# Patient Record
Sex: Male | Born: 1960 | Race: White | Hispanic: No | Smoking: Never smoker
Health system: Southern US, Community
[De-identification: ages and names within clinical notes are randomized; demographics above are authoritative.]

## PROBLEM LIST (undated history)

## (undated) DIAGNOSIS — F329 Major depressive disorder, single episode, unspecified: Secondary | ICD-10-CM

## (undated) DIAGNOSIS — C801 Malignant (primary) neoplasm, unspecified: Secondary | ICD-10-CM

## (undated) DIAGNOSIS — F32A Depression, unspecified: Secondary | ICD-10-CM

## (undated) DIAGNOSIS — I10 Essential (primary) hypertension: Secondary | ICD-10-CM

## (undated) DIAGNOSIS — I7300 Raynaud's syndrome without gangrene: Secondary | ICD-10-CM

## (undated) DIAGNOSIS — K219 Gastro-esophageal reflux disease without esophagitis: Secondary | ICD-10-CM

## (undated) HISTORY — PX: MELANOMA EXCISION: SHX5266

## (undated) HISTORY — PX: BACK SURGERY: SHX140

## (undated) HISTORY — PX: KNEE SURGERY: SHX244

---

## 2003-12-22 ENCOUNTER — Other Ambulatory Visit: Admission: RE | Admit: 2003-12-22 | Discharge: 2003-12-22 | Payer: Self-pay | Admitting: Dermatology

## 2004-02-24 ENCOUNTER — Encounter (HOSPITAL_COMMUNITY): Admission: RE | Admit: 2004-02-24 | Discharge: 2004-02-24 | Payer: Self-pay | Admitting: Orthopedic Surgery

## 2005-07-17 ENCOUNTER — Emergency Department (HOSPITAL_COMMUNITY): Admission: EM | Admit: 2005-07-17 | Discharge: 2005-07-17 | Payer: Self-pay | Admitting: Emergency Medicine

## 2005-10-11 ENCOUNTER — Ambulatory Visit: Payer: Self-pay | Admitting: *Deleted

## 2005-10-17 ENCOUNTER — Encounter (HOSPITAL_COMMUNITY): Admission: RE | Admit: 2005-10-17 | Discharge: 2005-11-16 | Payer: Self-pay | Admitting: *Deleted

## 2005-10-17 ENCOUNTER — Ambulatory Visit: Payer: Self-pay | Admitting: *Deleted

## 2005-10-23 ENCOUNTER — Ambulatory Visit: Payer: Self-pay | Admitting: *Deleted

## 2006-03-18 ENCOUNTER — Ambulatory Visit (HOSPITAL_COMMUNITY): Admission: RE | Admit: 2006-03-18 | Discharge: 2006-03-18 | Payer: Self-pay | Admitting: Neurology

## 2006-05-07 ENCOUNTER — Emergency Department (HOSPITAL_COMMUNITY): Admission: EM | Admit: 2006-05-07 | Discharge: 2006-05-08 | Payer: Self-pay | Admitting: Emergency Medicine

## 2006-09-04 ENCOUNTER — Emergency Department (HOSPITAL_COMMUNITY): Admission: EM | Admit: 2006-09-04 | Discharge: 2006-09-04 | Payer: Self-pay | Admitting: Emergency Medicine

## 2006-10-17 ENCOUNTER — Emergency Department (HOSPITAL_COMMUNITY): Admission: EM | Admit: 2006-10-17 | Discharge: 2006-10-17 | Payer: Self-pay | Admitting: Emergency Medicine

## 2007-02-04 ENCOUNTER — Emergency Department (HOSPITAL_COMMUNITY): Admission: EM | Admit: 2007-02-04 | Discharge: 2007-02-04 | Payer: Self-pay | Admitting: Emergency Medicine

## 2007-02-28 ENCOUNTER — Inpatient Hospital Stay (HOSPITAL_COMMUNITY): Admission: EM | Admit: 2007-02-28 | Discharge: 2007-03-01 | Payer: Self-pay | Admitting: Emergency Medicine

## 2009-02-21 IMAGING — CR DG KNEE COMPLETE 4+V*R*
4 series · 4 of 4 positions shown · non-contrast
Comparison: None.

CLINICAL DATA: Fall, pain.
 RIGHT SHOULDER ? 3 VIEW:

[view not recorded (1 of 4)]
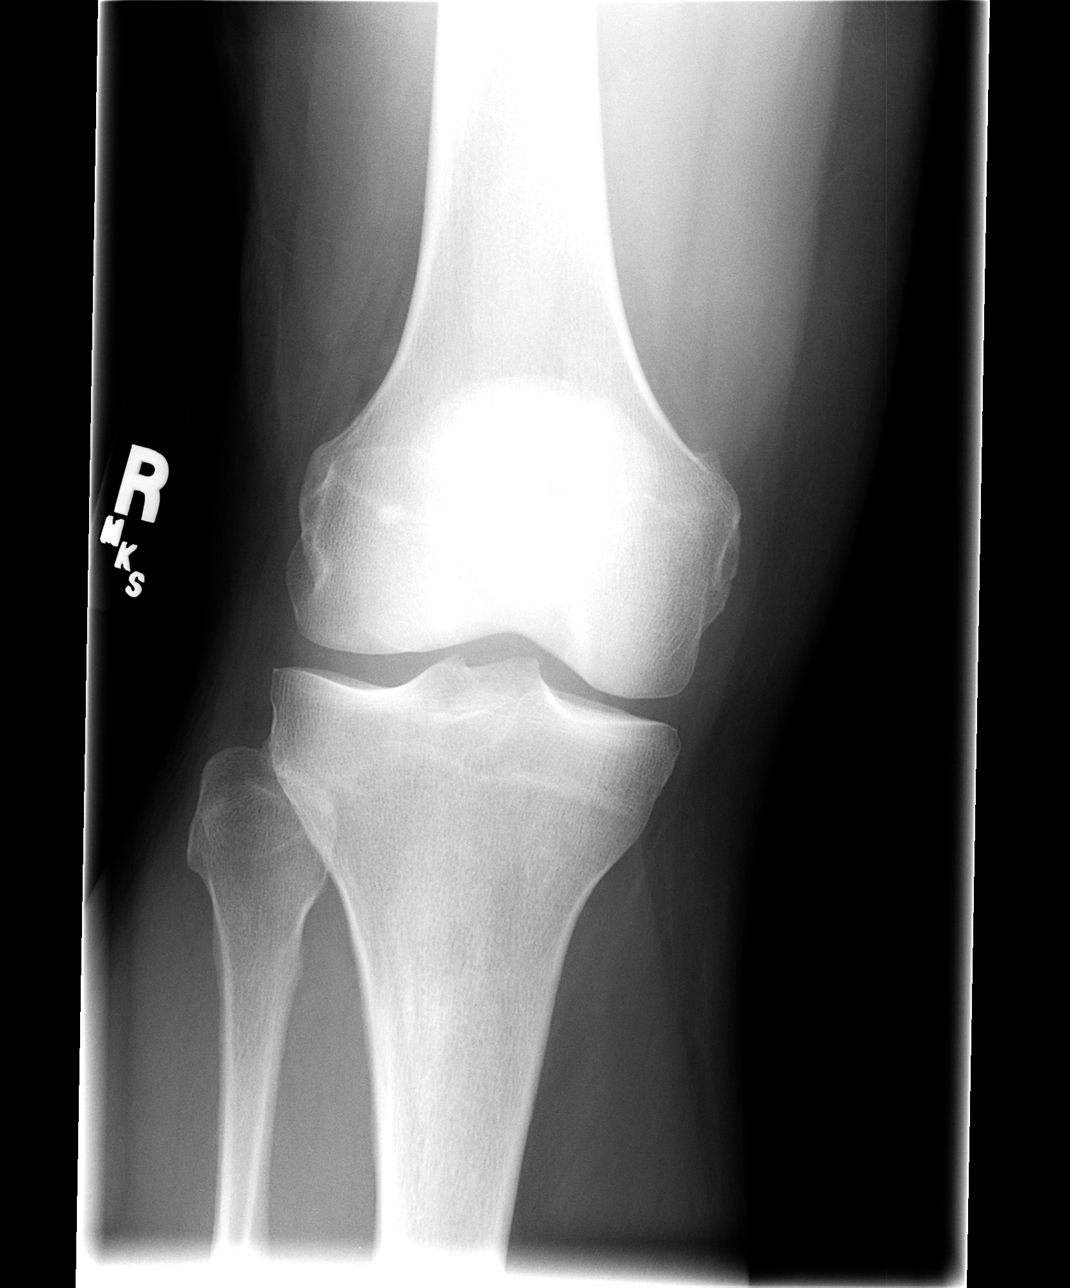

[view not recorded (2 of 4)]
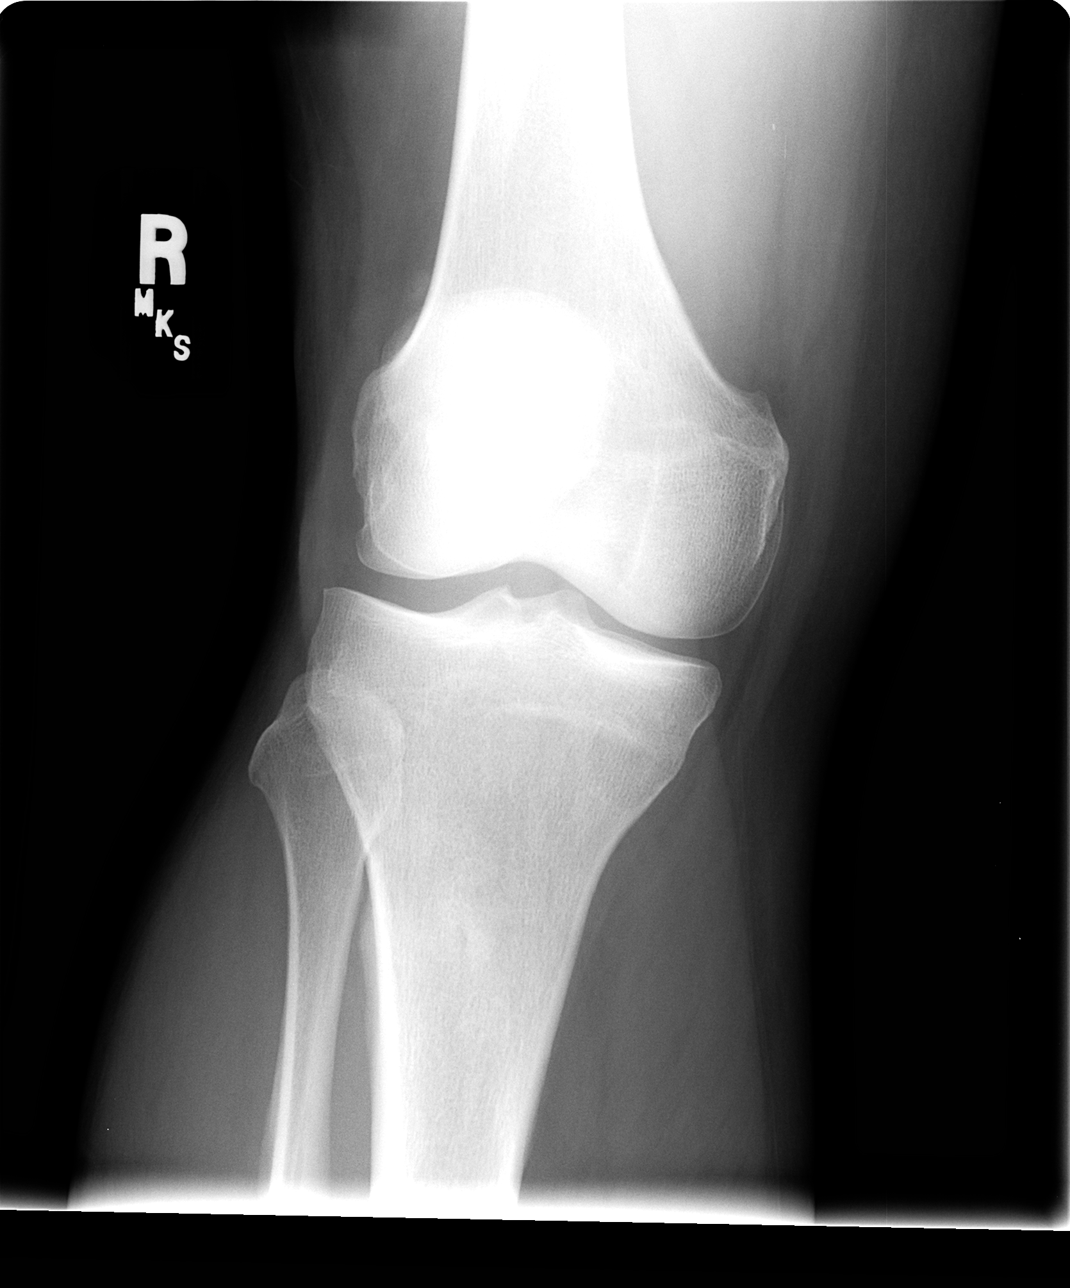

[view not recorded (3 of 4)]
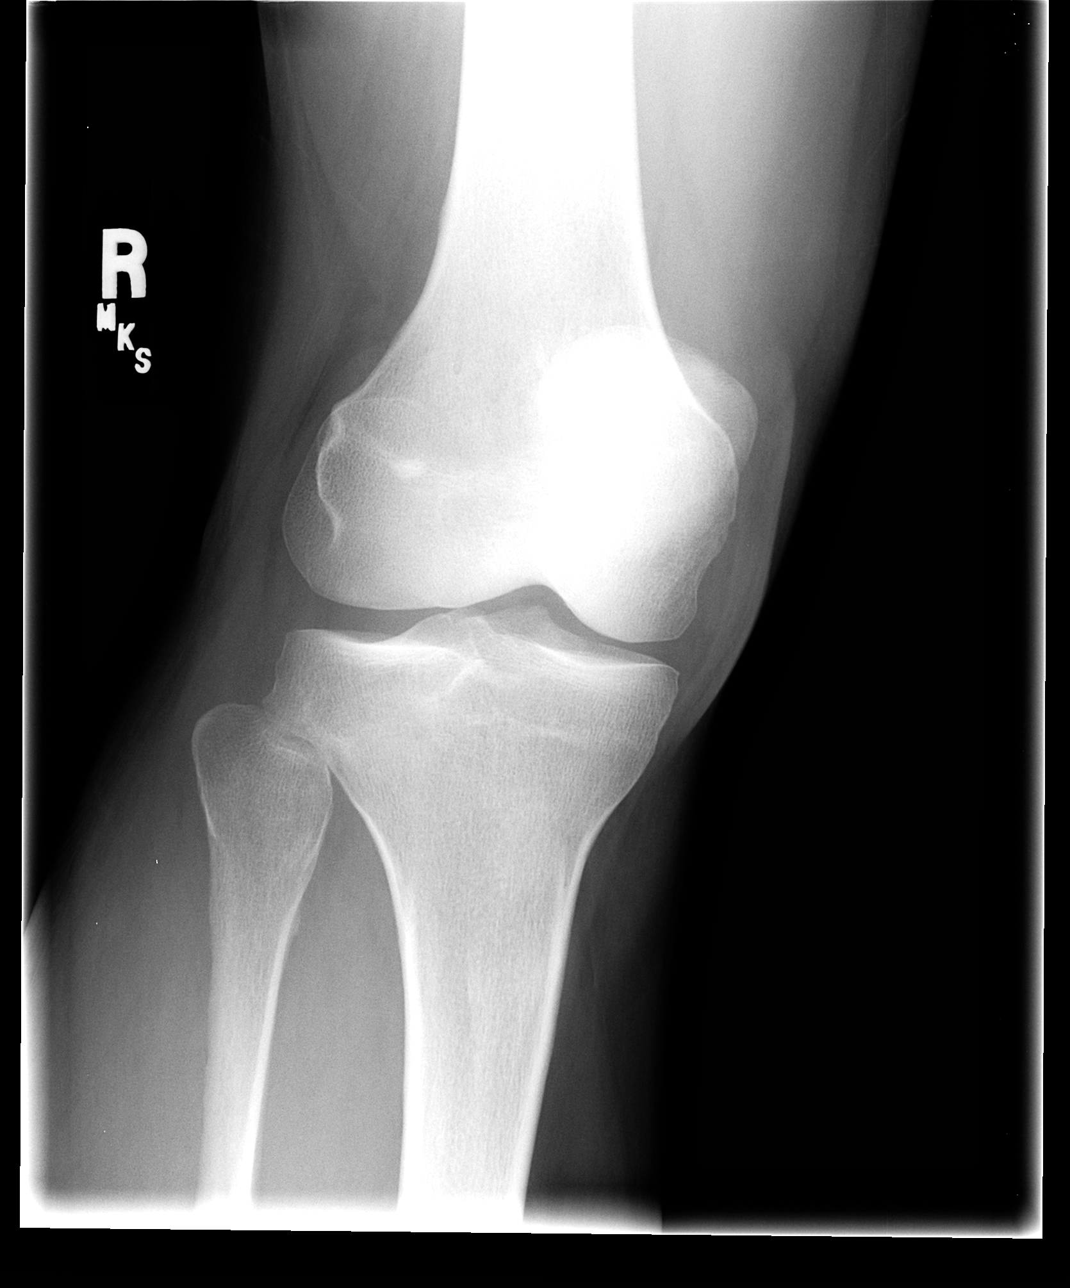

[view not recorded (4 of 4)]
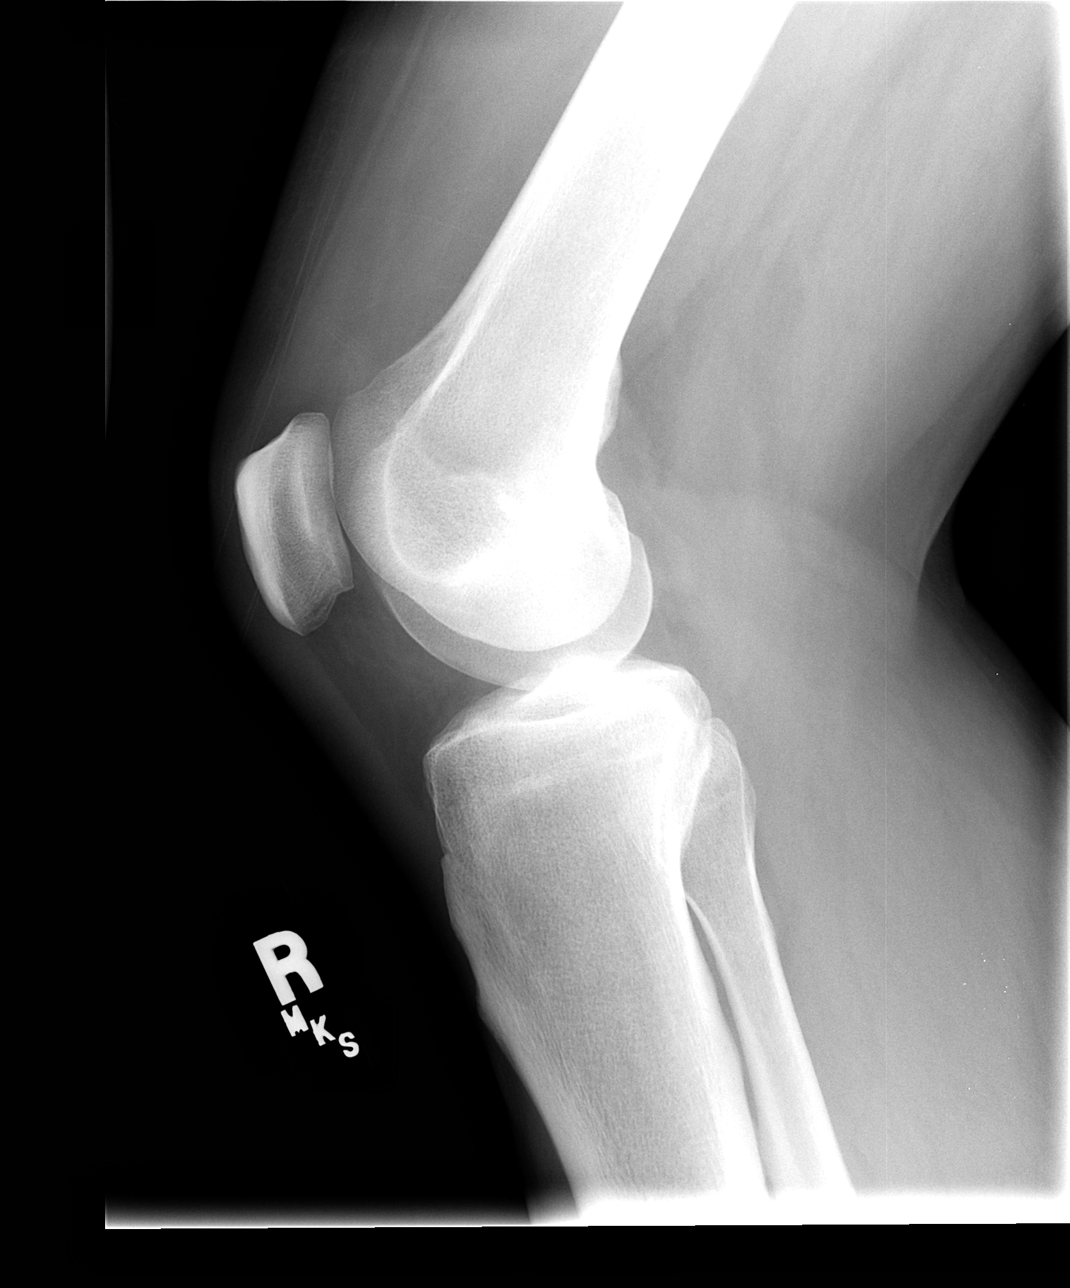

[4 of 4 positions shown; findings below may reference images not displayed]

FINDINGS: The humerus is located.  There is no fracture.  Minimal degenerative disease of the acromioclavicular joint is noted.  The imaged right lung and ribs appear normal.
IMPRESSION: No acute abnormality. 
 RIGHT KNEE ? 4 VIEW:
FINDINGS: No acute bony or joint abnormality is identified.  No radiopaque foreign body.
IMPRESSION: Negative study.

## 2009-11-08 ENCOUNTER — Emergency Department: Payer: Self-pay | Admitting: Unknown Physician Specialty

## 2010-08-21 DIAGNOSIS — M545 Low back pain, unspecified: Secondary | ICD-10-CM | POA: Insufficient documentation
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
## 2010-09-29 NOTE — Procedures (Signed)
NAME:  Roger Mack, CAMPS NO.:  [id number]   MEDICAL RECORD NO.:  [id number]          PATIENT TYPE:  REC   LOCATION:                                FACILITY:  [facility]   PHYSICIAN:  Vida Roller, M.D.   DATE OF BIRTH:  [birth year]   DATE OF PROCEDURE:  DATE OF DISCHARGE:                                  ECHOCARDIOGRAM   PRIMARY CARE PHYSICIAN:  Dr. Sherwood Gambler.   TAPE NUMBER:  [id number]   TAPE COUNT:  387 through 816   INDICATION:  This is a man with chest pain and an abnormal  electrocardiogram.   FINDINGS:  Technical quality of the study is adequate.   M-MODE TRACING:  The aorta is 35 mm.   The left atrium is 40 mg.   The septum is 11 mm.   Posterior wall is 12 mm.   Left ventricular diastolic dimension is 47 mm.   The left ventricular systolic dimension is 31 mm.   TWO-DIMENSIONAL AND DOPPLER IMAGING:  The left ventricle is of normal size  with preserved LV systolic function, estimated ejection fraction 55% to 60%.  There is mild concentric left ventricular hypertrophy with no wall motion  abnormalities.   The right ventricle is mildly enlarged with preserved RV systolic function.   Both atria are mildly dilated.   The aortic valve is sclerotic without stenosis or regurgitation.   Mitral valve has trivial regurgitation.   Tricuspid valve has mildly regurgitation.   There is no pericardial effusion.      Vida Roller, M.D.  Electronically Signed     JH/MEDQ  D:  10/17/2005  T:  10/18/2005  Job:  [id number]

## 2010-09-29 NOTE — Procedures (Signed)
NAME:  [name], GROSSER NO.:  [id number]   MEDICAL RECORD NO.:  [id number]          PATIENT TYPE:  REC   LOCATION:                                FACILITY:  [facility]   PHYSICIAN:  Vida Roller, M.D.   DATE OF BIRTH:  [birth year]   DATE OF PROCEDURE:  10/17/2005  DATE OF DISCHARGE:                                    STRESS TEST   HISTORY:  Mr. Roger Mack is a 50 year old male with no known coronary disease  with atypical chest discomfort.  Cardiac risk factors include hypertension,  male sex, family history.   BASELINE DATA:  Electrocardiogram reveals sinus bradycardia at 54 beats per  minute, some small Q waves noted in the inferior leads.  Blood pressure is  110/80.   50 mg of adenosine was infused over 4-minute protocol.  Myoview was injected  at 3 minutes. The patient reported flushed feeling and abdominal discomfort  that resolved in recovery.  EKG revealed no significant arrhythmias.  No  ischemic changes were noted.   Final images and results are pending MD review.      Jae Dire, P.A. [facility]      Vida Roller, M.D.  Electronically Signed    AB/MEDQ  D:  10/17/2005  T:  10/17/2005  Job:  [id number]

## 2010-10-26 LAB — PSA: PSA: 1.7

## 2010-10-26 LAB — TSH: TSH: 2.02 u[IU]/mL (ref 0.41–5.90)

## 2010-11-18 LAB — CBC AND DIFFERENTIAL
HEMATOCRIT: 37 % — AB (ref 41–53)
HEMOGLOBIN: 12.2 g/dL — AB (ref 13.5–17.5)
Neutrophils Absolute: 3 /uL
Platelets: 242 10*3/uL (ref 150–399)
WBC: 5.4 10^3/mL

## 2011-02-21 LAB — BASIC METABOLIC PANEL
Calcium: 8 — ABNORMAL LOW
Creatinine, Ser: 1.01
GFR calc Af Amer: >60
GFR calc non Af Amer: >60
Sodium: 136

## 2011-02-21 LAB — POCT I-STAT CREATININE
Creatinine, Ser: 0.7
Creatinine, Ser: 1.3
Operator id: 291361
Operator id: 291361

## 2011-02-21 LAB — LIPASE, BLOOD: Lipase: 25

## 2011-02-21 LAB — CROSSMATCH
ABO/RH(D): O POS
Antibody Screen: NEGATIVE

## 2011-02-21 LAB — URINALYSIS, ROUTINE W REFLEX MICROSCOPIC
Bilirubin Urine: NEGATIVE
Glucose, UA: 100 — AB
Ketones, ur: NEGATIVE
Leukocytes, UA: NEGATIVE
Nitrite: NEGATIVE
Protein, ur: NEGATIVE
Specific Gravity, Urine: 1.02
Urobilinogen, UA: 0.2
pH: 7.5

## 2011-02-21 LAB — I-STAT 8, (EC8 V) (CONVERTED LAB)
Acid-base deficit: 4 — ABNORMAL HIGH
BUN: 9
Bicarbonate: 21
Chloride: 108
Glucose, Bld: 176 — ABNORMAL HIGH
HCT: 44
Hemoglobin: 15
Operator id: 291361
Potassium: 5.2 — ABNORMAL HIGH
Sodium: 137
TCO2: 22
pCO2, Ven: 36.3 — ABNORMAL LOW
pH, Ven: 7.37 — ABNORMAL HIGH

## 2011-02-21 LAB — COMPREHENSIVE METABOLIC PANEL WITH GFR
AST: 28
Albumin: 3.2 — ABNORMAL LOW
Calcium: 8.6
Creatinine, Ser: 1.2
GFR calc Af Amer: >60

## 2011-02-21 LAB — COMPREHENSIVE METABOLIC PANEL
ALT: 20
Alkaline Phosphatase: 68
BUN: 8
CO2: 31
Chloride: 106
GFR calc non Af Amer: >60
Glucose, Bld: 132 — ABNORMAL HIGH
Potassium: 4.1
Sodium: 140
Total Bilirubin: 0.4
Total Protein: 5.8 — ABNORMAL LOW

## 2011-02-21 LAB — DIFFERENTIAL
Basophils Absolute: 0
Basophils Absolute: 0.1
Basophils Relative: 0
Basophils Relative: 1
Eosinophils Absolute: 0.1
Eosinophils Absolute: 0.1
Eosinophils Relative: 1
Eosinophils Relative: 2
Lymphocytes Relative: 13
Lymphocytes Relative: 36
Lymphs Abs: 1.2
Lymphs Abs: 1.6
Monocytes Absolute: 0.2
Monocytes Absolute: 0.5
Monocytes Relative: 4
Monocytes Relative: 6
Neutro Abs: 1.8
Neutro Abs: 9.6 — ABNORMAL HIGH
Neutrophils Relative %: 56
Neutrophils Relative %: 81 — ABNORMAL HIGH

## 2011-02-21 LAB — CBC
HCT: 19 — ABNORMAL LOW
HCT: 39.9
Hemoglobin: 12 — ABNORMAL LOW
Hemoglobin: 13.3
Hemoglobin: 6.4 — CL
MCHC: 33.2
MCHC: 33.8
MCHC: 34.1
MCV: 86.2
MCV: 86.8
Platelets: 121 — ABNORMAL LOW
Platelets: 212
RBC: 2.2 — ABNORMAL LOW
RBC: 4.12 — ABNORMAL LOW
RBC: 4.6
RDW: 13.7
RDW: 14
WBC: 11.7 — ABNORMAL HIGH
WBC: 3.3 — ABNORMAL LOW

## 2011-02-21 LAB — URINE MICROSCOPIC-ADD ON

## 2011-02-21 LAB — ETHANOL: Alcohol, Ethyl (B): <5

## 2011-02-21 LAB — ABO/RH: ABO/RH(D): O POS

## 2011-03-22 LAB — LIPID PANEL
CHOLESTEROL: 177 mg/dL (ref 0–200)
HDL: 30 mg/dL — AB (ref 35–70)
LDL CALC: 118 mg/dL
TRIGLYCERIDES: 143 mg/dL (ref 40–160)

## 2011-03-22 LAB — HEMOGLOBIN A1C: Hemoglobin A1C: 5.9

## 2012-04-23 LAB — BASIC METABOLIC PANEL
BUN: 11 mg/dL (ref 4–21)
Creatinine: 0.8 mg/dL (ref 0.6–1.3)
Glucose: 97 mg/dL
SODIUM: 139 mmol/L (ref 137–147)

## 2013-02-18 ENCOUNTER — Encounter (HOSPITAL_COMMUNITY): Payer: Self-pay | Admitting: Emergency Medicine

## 2013-02-18 ENCOUNTER — Emergency Department (HOSPITAL_COMMUNITY)
Admission: EM | Admit: 2013-02-18 | Discharge: 2013-02-19 | Disposition: A | Discharge status: Home / Self Care | Payer: Self-pay | Attending: Emergency Medicine | Admitting: Emergency Medicine

## 2013-02-18 DIAGNOSIS — R52 Pain, unspecified: Secondary | ICD-10-CM | POA: Insufficient documentation

## 2013-02-18 DIAGNOSIS — G8929 Other chronic pain: Secondary | ICD-10-CM | POA: Insufficient documentation

## 2013-02-18 DIAGNOSIS — Z85820 Personal history of malignant melanoma of skin: Secondary | ICD-10-CM | POA: Insufficient documentation

## 2013-02-18 DIAGNOSIS — M545 Low back pain, unspecified: Secondary | ICD-10-CM | POA: Insufficient documentation

## 2013-02-18 DIAGNOSIS — Z8669 Personal history of other diseases of the nervous system and sense organs: Secondary | ICD-10-CM | POA: Insufficient documentation

## 2013-02-18 DIAGNOSIS — Z79899 Other long term (current) drug therapy: Secondary | ICD-10-CM | POA: Insufficient documentation

## 2013-02-18 DIAGNOSIS — M542 Cervicalgia: Secondary | ICD-10-CM | POA: Insufficient documentation

## 2013-02-18 HISTORY — DX: Malignant (primary) neoplasm, unspecified: C80.1

## 2013-02-18 HISTORY — DX: Raynaud's syndrome without gangrene: I73.00

## 2013-02-18 MED ORDER — METHOCARBAMOL 750 MG PO TABS: 750.0000 mg | ORAL_TABLET | Freq: Three times a day (TID) | ORAL | Status: DC

## 2013-02-18 MED ORDER — IBUPROFEN 600 MG PO TABS: 600.0000 mg | ORAL_TABLET | Freq: Four times a day (QID) | ORAL | Status: DC | PRN

## 2013-02-18 MED ORDER — METHOCARBAMOL 500 MG PO TABS
750.0000 mg | ORAL_TABLET | Freq: Once | ORAL | Status: AC
  Administered 2013-02-18: 750 mg via ORAL
  Filled 2013-02-18: qty 2

## 2013-02-18 MED ORDER — KETOROLAC TROMETHAMINE 30 MG/ML IJ SOLN
30.0000 mg | Freq: Once | INTRAMUSCULAR | Status: AC
  Administered 2013-02-18: 30 mg via INTRAMUSCULAR
  Filled 2013-02-18: qty 1

## 2013-02-18 NOTE — ED Notes (Signed)
Pt presented d/t lower back pain and stiffness in neck.  Pt had back surgery last year in August at [facility].  Pt states he was riding in a car today for approximately 4 hrs which he contributes to the increase in pain.

## 2013-02-18 NOTE — ED Provider Notes (Signed)
CSN: [id number]     Arrival date & time 02/18/13  2114 History  This chart was scribed for Roger Filter, NP,  working with Roger Munch, MD, by Allene Dillon, ED Scribe. This patient was seen in room WTR5/WTR5 and the patient's care was started at 11:20 PM.    Chief Complaint  Patient presents with  . Back Pain    The history is provided by the patient. No language interpreter was used.   HPI Comments: [name] is a 52 y.o. male who presents to the Emergency Department complaining of gradually worsening, constant lower back pain which began 4 hours ago while sitting in car for an extended period. Pt has associated neck pain and neck stiffness. Pt had surgery last august at [facility], they inserted a synthetic bone into his back. Pt has a history of chronic back pain. He was referred to [facility]  pain management clinic, but has remained on the wait list since his last surgery. Pt denies weakness, numbness, or any other symptoms.   Past Medical History  Diagnosis Date  . Raynaud disease   . Cancer     testicular in 86, skin s/p melanoma removal   Past Surgical History  Procedure Laterality Date  . Back surgery    . Knee surgery Left    History reviewed. No pertinent family history. History  Substance Use Topics  . Smoking status: Never Smoker   . Smokeless tobacco: Not on file  . Alcohol Use: No    Review of Systems  Musculoskeletal: Positive for back pain, neck pain and neck stiffness.  Neurological: Negative for weakness and numbness.  All other systems reviewed and are negative.    Allergies  Review of patient's allergies indicates no known allergies.  Home Medications   Current Outpatient Rx  Name  Route  Sig  Dispense  Refill  . acetaminophen (TYLENOL) 325 MG tablet   Oral   Take 650 mg by mouth every 6 (six) hours as needed for pain (pain).         Marland Kitchen esomeprazole (NEXIUM) 20 MG capsule   Oral   Take 20 mg by mouth daily before breakfast.         .  hydrochlorothiazide (HYDRODIURIL) 25 MG tablet   Oral   Take 25 mg by mouth daily.         Marland Kitchen lisinopril (PRINIVIL,ZESTRIL) 20 MG tablet   Oral   Take 20 mg by mouth daily.         Marland Kitchen ibuprofen (ADVIL,MOTRIN) 600 MG tablet   Oral   Take 1 tablet (600 mg total) by mouth every 6 (six) hours as needed for pain.   30 tablet   0   . methocarbamol (ROBAXIN) 750 MG tablet   Oral   Take 1 tablet (750 mg total) by mouth 3 (three) times daily.   30 tablet   0    Triage Vitals: BP 120/85  Pulse 76  Temp(Src) 99.2 F (37.3 C) (Oral)  Resp 20  SpO2 98%  Physical Exam  Nursing note and vitals reviewed. Constitutional: He is oriented to person, place, and time. He appears well-developed and well-nourished. No distress.  HENT:  Head: Normocephalic and atraumatic.  Right Ear: External ear normal.  Left Ear: External ear normal.  Nose: Nose normal.  Eyes: Conjunctivae are normal.  Neck: Normal range of motion. No tracheal deviation present.  Cardiovascular: Normal rate, regular rhythm and normal heart sounds.   Pulmonary/Chest: Effort normal and  breath sounds normal. No stridor.  Abdominal: Soft. He exhibits no distension. There is no tenderness.  Musculoskeletal: Normal range of motion.  Neurological: He is alert and oriented to person, place, and time.  Skin: Skin is warm and dry. He is not diaphoretic.  Psychiatric: He has a normal mood and affect. His behavior is normal.    ED Course  Procedures (including critical care time) DIAGNOSTIC STUDIES: Oxygen Saturation is 98% on RA, normal by my interpretation.    COORDINATION OF CARE: 11:22 PM- Pt advised of plan for treatment and pt agrees.   Labs Review Labs Reviewed - No data to display Imaging Review No results found.  MDM   1. Acute exacerbation of chronic low back pain       I personally performed the services described in this documentation, which was scribed in my presence. The recorded information has been  reviewed and is accurate.  Roger Filter, NP 02/18/13 [phone]

## 2013-02-19 NOTE — ED Provider Notes (Signed)
  Medical screening examination/treatment/procedure(s) were performed by non-physician practitioner and as supervising physician I was immediately available for consultation/collaboration.    Mayeli Bornhorst, MD 02/19/13 0545 

## 2013-06-04 DIAGNOSIS — I7300 Raynaud's syndrome without gangrene: Secondary | ICD-10-CM | POA: Insufficient documentation

## 2013-06-04 DIAGNOSIS — I10 Essential (primary) hypertension: Secondary | ICD-10-CM | POA: Insufficient documentation

## 2013-06-11 ENCOUNTER — Ambulatory Visit: Payer: Self-pay | Admitting: Ophthalmology

## 2013-06-11 DIAGNOSIS — I10 Essential (primary) hypertension: Secondary | ICD-10-CM

## 2013-06-11 LAB — POTASSIUM: Potassium: 4 mmol/L (ref 3.5–5.1)

## 2013-06-23 ENCOUNTER — Ambulatory Visit: Payer: Self-pay | Admitting: Ophthalmology

## 2014-07-30 DIAGNOSIS — K219 Gastro-esophageal reflux disease without esophagitis: Secondary | ICD-10-CM | POA: Insufficient documentation

## 2014-09-04 NOTE — Op Note (Signed)
PATIENT NAME:  Roger Mack, Roger Mack MR#:  [id number] DATE OF BIRTH:  15-Jul-1960  DATE OF PROCEDURE:  06/23/2013  PREOPERATIVE DIAGNOSIS: Visually significant cataract of the right eye.   POSTOPERATIVE DIAGNOSIS: Visually significant cataract of the right eye.   OPERATIVE PROCEDURE: Cataract extraction by phacoemulsification with implant of intraocular lens to the right eye.   SURGEON: Birder Robson, MD  ANESTHESIA:  1. Managed anesthesia care.  2. 50-50 mixture of 0.75% bupivacaine and 4% Xylocaine given as a retrobulbar block.   COMPLICATIONS: None.   TECHNIQUE:  Stop and chop.   DESCRIPTION OF PROCEDURE: The patient was examined and consented for this procedure in the preoperative holding area and then brought back to the Operating Room where the anesthesia team employed managed anesthesia care.  3.5 milliliters of the aforementioned mixture were placed in the right orbit on an Atkinson needle without complication. The right eye was then prepped and draped in the usual sterile ophthalmic fashion. A lid speculum was placed. The side-port blade was used to create a paracentesis and the anterior chamber was filled with viscoelastic. The keratome was used to create a near clear corneal incision. The continuous curvilinear capsulorrhexis was performed with a cystotome followed by the capsulorrhexis forceps. Hydrodissection and hydrodelineation were carried out with BSS on a blunt cannula. The lens was removed in a stop and chop technique. The remaining cortical material was removed with the irrigation-aspiration handpiece. The capsular bag was inflated with viscoelastic and the Tecnis ZCB00 18.0-diopter lens, serial number [id number] was placed in the capsular bag without complication. The remaining viscoelastic was removed from the eye with the irrigation-aspiration handpiece. The wounds were hydrated. The anterior chamber was flushed with Miostat and the eye was inflated to a physiologic pressure.  0.1 mL of cefuroxime concentration 10 mg/mL was placed in the anterior chamber. The wounds were found to be water tight. The eye was dressed with Vigamox followed by Maxitrol ointment and a protective shield was placed. The patient will follow up with me in one day.   ____________________________ Livingston Diones. Maryama Kuriakose, MD wlp:gb D: 06/23/2013 20:57:43 ET T: 06/23/2013 21:40:11 ET JOB#: [id number]  cc: Mahathi Pokorney L. Griffin Gerrard, MD, <Dictator> Livingston Diones Kikue Gerhart MD ELECTRONICALLY SIGNED 07/02/2013 11:57

## 2015-10-21 ENCOUNTER — Ambulatory Visit
Admission: RE | Admit: 2015-10-21 | Discharge: 2015-10-21 | Disposition: A | Discharge status: Home / Self Care | Payer: Disability Insurance | Source: Ambulatory Visit | Attending: Family Medicine | Admitting: Family Medicine

## 2015-10-21 ENCOUNTER — Other Ambulatory Visit: Payer: Self-pay | Admitting: Family Medicine

## 2015-10-21 DIAGNOSIS — M25511 Pain in right shoulder: Secondary | ICD-10-CM | POA: Insufficient documentation

## 2015-10-21 DIAGNOSIS — M25561 Pain in right knee: Secondary | ICD-10-CM

## 2015-10-21 DIAGNOSIS — M25512 Pain in left shoulder: Secondary | ICD-10-CM | POA: Insufficient documentation

## 2015-10-21 DIAGNOSIS — M47812 Spondylosis without myelopathy or radiculopathy, cervical region: Secondary | ICD-10-CM | POA: Diagnosis not present

## 2015-10-21 DIAGNOSIS — M25562 Pain in left knee: Secondary | ICD-10-CM | POA: Diagnosis not present

## 2015-10-21 DIAGNOSIS — G8929 Other chronic pain: Secondary | ICD-10-CM | POA: Insufficient documentation

## 2015-10-21 DIAGNOSIS — M542 Cervicalgia: Secondary | ICD-10-CM | POA: Insufficient documentation

## 2015-11-01 ENCOUNTER — Encounter: Payer: Self-pay | Admitting: *Deleted

## 2015-11-08 ENCOUNTER — Ambulatory Visit: Admitting: Anesthesiology

## 2015-11-08 ENCOUNTER — Encounter: Payer: Self-pay | Admitting: *Deleted

## 2015-11-08 ENCOUNTER — Encounter
Admission: RE | Disposition: A | Discharge status: Home / Self Care | Payer: Self-pay | Source: Ambulatory Visit | Attending: Ophthalmology

## 2015-11-08 ENCOUNTER — Ambulatory Visit
Admission: RE | Admit: 2015-11-08 | Discharge: 2015-11-08 | Disposition: A | Discharge status: Home / Self Care | Source: Ambulatory Visit | Attending: Ophthalmology | Admitting: Ophthalmology

## 2015-11-08 DIAGNOSIS — I739 Peripheral vascular disease, unspecified: Secondary | ICD-10-CM | POA: Insufficient documentation

## 2015-11-08 DIAGNOSIS — H2512 Age-related nuclear cataract, left eye: Secondary | ICD-10-CM | POA: Diagnosis present

## 2015-11-08 DIAGNOSIS — I10 Essential (primary) hypertension: Secondary | ICD-10-CM | POA: Diagnosis not present

## 2015-11-08 DIAGNOSIS — K219 Gastro-esophageal reflux disease without esophagitis: Secondary | ICD-10-CM | POA: Diagnosis not present

## 2015-11-08 DIAGNOSIS — F329 Major depressive disorder, single episode, unspecified: Secondary | ICD-10-CM | POA: Insufficient documentation

## 2015-11-08 HISTORY — DX: Major depressive disorder, single episode, unspecified: F32.9

## 2015-11-08 HISTORY — DX: Gastro-esophageal reflux disease without esophagitis: K21.9

## 2015-11-08 HISTORY — PX: CATARACT EXTRACTION W/PHACO: SHX586

## 2015-11-08 HISTORY — DX: Essential (primary) hypertension: I10

## 2015-11-08 HISTORY — DX: Depression, unspecified: F32.A

## 2015-11-08 SURGERY — PHACOEMULSIFICATION, CATARACT, WITH IOL INSERTION
Anesthesia: Monitor Anesthesia Care | Site: Eye | Laterality: Left | Wound class: Clean

## 2015-11-08 MED ORDER — POVIDONE-IODINE 5 % OP SOLN
OPHTHALMIC | Status: AC
  Filled 2015-11-08: qty 30

## 2015-11-08 MED ORDER — NA CHONDROIT SULF-NA HYALURON 40-17 MG/ML IO SOLN
INTRAOCULAR | Status: AC
  Filled 2015-11-08: qty 1

## 2015-11-08 MED ORDER — LIDOCAINE HCL (PF) 4 % IJ SOLN
INTRAMUSCULAR | Status: AC
  Filled 2015-11-08: qty 5

## 2015-11-08 MED ORDER — CEFUROXIME OPHTHALMIC INJECTION 1 MG/0.1 ML
INJECTION | OPHTHALMIC | Status: DC | PRN
  Administered 2015-11-08: .1 mL via INTRACAMERAL

## 2015-11-08 MED ORDER — LIDOCAINE HCL (PF) 4 % IJ SOLN
INTRAOCULAR | Status: DC | PRN
  Administered 2015-11-08: .5 mL via OPHTHALMIC

## 2015-11-08 MED ORDER — ARMC OPHTHALMIC DILATING GEL
OPHTHALMIC | Status: AC
Start: 2015-11-08 — End: 2015-11-08
  Filled 2015-11-08: qty 0.25

## 2015-11-08 MED ORDER — TETRACAINE HCL 0.5 % OP SOLN
OPHTHALMIC | Status: AC
  Filled 2015-11-08: qty 2

## 2015-11-08 MED ORDER — MOXIFLOXACIN HCL 0.5 % OP SOLN
OPHTHALMIC | Status: AC
  Filled 2015-11-08: qty 3

## 2015-11-08 MED ORDER — CEFUROXIME OPHTHALMIC INJECTION 1 MG/0.1 ML
INJECTION | OPHTHALMIC | Status: AC
  Filled 2015-11-08: qty 0.1

## 2015-11-08 MED ORDER — MOXIFLOXACIN HCL 0.5 % OP SOLN
OPHTHALMIC | Status: DC | PRN
  Administered 2015-11-08: 1 [drp] via OPHTHALMIC

## 2015-11-08 MED ORDER — POVIDONE-IODINE 5 % OP SOLN
1.0000 | Freq: Once | OPHTHALMIC | Status: AC
Start: 2015-11-08 — End: 2015-11-08
  Administered 2015-11-08: 1 via OPHTHALMIC

## 2015-11-08 MED ORDER — ARMC OPHTHALMIC DILATING GEL
1.0000 "application " | OPHTHALMIC | Status: AC
  Administered 2015-11-08: 12:00:00 via OPHTHALMIC
  Administered 2015-11-08: 1 via OPHTHALMIC

## 2015-11-08 MED ORDER — NA CHONDROIT SULF-NA HYALURON 40-17 MG/ML IO SOLN
INTRAOCULAR | Status: DC | PRN
  Administered 2015-11-08: 1 mL via INTRAOCULAR

## 2015-11-08 MED ORDER — TETRACAINE HCL 0.5 % OP SOLN
1.0000 [drp] | Freq: Once | OPHTHALMIC | Status: AC
  Administered 2015-11-08: 1 [drp] via OPHTHALMIC

## 2015-11-08 MED ORDER — BSS IO SOLN
INTRAOCULAR | Status: DC | PRN
  Administered 2015-11-08: 1 mL via OPHTHALMIC

## 2015-11-08 MED ORDER — MIDAZOLAM HCL 2 MG/2ML IJ SOLN
INTRAMUSCULAR | Status: DC | PRN
  Administered 2015-11-08: 1 mg via INTRAVENOUS

## 2015-11-08 MED ORDER — EPINEPHRINE HCL 1 MG/ML IJ SOLN
INTRAMUSCULAR | Status: AC
  Filled 2015-11-08: qty 2

## 2015-11-08 MED ORDER — MOXIFLOXACIN HCL 0.5 % OP SOLN: 1.0000 [drp] | OPHTHALMIC | Status: DC | PRN

## 2015-11-08 MED ORDER — SODIUM CHLORIDE 0.9 % IV SOLN
INTRAVENOUS | Status: DC
  Administered 2015-11-08: 11:00:00 via INTRAVENOUS

## 2015-11-08 MED ORDER — ALFENTANIL 500 MCG/ML IJ INJ
INJECTION | INTRAMUSCULAR | Status: DC | PRN
  Administered 2015-11-08: 500 ug via INTRAVENOUS

## 2015-11-08 MED ORDER — POVIDONE-IODINE 5 % OP SOLN
OPHTHALMIC | Status: DC | PRN
  Administered 2015-11-08: 1 via OPHTHALMIC

## 2015-11-08 MED ORDER — CARBACHOL 0.01 % IO SOLN
INTRAOCULAR | Status: DC | PRN
  Administered 2015-11-08: .5 mL via INTRAOCULAR

## 2015-11-08 SURGICAL SUPPLY — 22 items
CANNULA ANT/CHMB 27G (MISCELLANEOUS) ×1 IMPLANT
CANNULA ANT/CHMB 27GA (MISCELLANEOUS) ×3 IMPLANT
CUP MEDICINE 2OZ PLAST GRAD ST (MISCELLANEOUS) ×3 IMPLANT
GLOVE BIO SURGEON STRL SZ8 (GLOVE) ×3 IMPLANT
GLOVE BIOGEL M 6.5 STRL (GLOVE) ×3 IMPLANT
GLOVE SURG LX 8.0 MICRO (GLOVE) ×2
GLOVE SURG LX STRL 8.0 MICRO (GLOVE) ×1 IMPLANT
GOWN STRL REUS W/ TWL LRG LVL3 (GOWN DISPOSABLE) ×2 IMPLANT
GOWN STRL REUS W/TWL LRG LVL3 (GOWN DISPOSABLE) ×6
LENS IOL TECNIS ITEC 19.0 (Intraocular Lens) ×2 IMPLANT
PACK CATARACT (MISCELLANEOUS) ×3 IMPLANT
PACK CATARACT BRASINGTON LX (MISCELLANEOUS) ×3 IMPLANT
PACK EYE AFTER SURG (MISCELLANEOUS) ×3 IMPLANT
SOL BSS BAG (MISCELLANEOUS) ×3
SOL PREP PVP 2OZ (MISCELLANEOUS) ×3
SOLUTION BSS BAG (MISCELLANEOUS) ×1 IMPLANT
SOLUTION PREP PVP 2OZ (MISCELLANEOUS) ×1 IMPLANT
SYR 3ML LL SCALE MARK (SYRINGE) ×3 IMPLANT
SYR 5ML LL (SYRINGE) ×3 IMPLANT
SYR TB 1ML 27GX1/2 LL (SYRINGE) ×3 IMPLANT
WATER STERILE IRR 1000ML POUR (IV SOLUTION) ×3 IMPLANT
WIPE NON LINTING 3.25X3.25 (MISCELLANEOUS) ×3 IMPLANT

## 2015-11-08 NOTE — Discharge Instructions (Signed)
Eye Surgery Discharge Instructions  Expect mild scratchy sensation or mild soreness. DO NOT RUB YOUR EYE!  The day of surgery:  Minimal physical activity, but bed rest is not required  No reading, computer work, or close hand work  No bending, lifting, or straining.  May watch TV  For 24 hours:  No driving, legal decisions, or alcoholic beverages  Safety precautions  Eat anything you prefer: It is better to start with liquids, then soup then solid foods.  _____ Eye patch should be worn until postoperative exam tomorrow.  ____ Solar shield eyeglasses should be worn for comfort in the sunlight/patch while sleeping  Resume all regular medications including aspirin or Coumadin if these were discontinued prior to surgery. You may shower, bathe, shave, or wash your hair. Tylenol may be taken for mild discomfort.  Call your doctor if you experience significant pain, nausea, or vomiting, fever > 101 or other signs of infection. [phone] or [phone] Specific instructions:  Follow-up Information    Follow up with Tim Lair, MD On 11/09/2015.   Specialty:  Ophthalmology   Why:  8:10   Contact information:   [address] [place] [address] [phone]
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
## 2015-11-08 NOTE — Op Note (Signed)
PREOPERATIVE DIAGNOSIS:  Nuclear sclerotic cataract of the left eye.   POSTOPERATIVE DIAGNOSIS:  nuclear sclerotic cataract left eye   OPERATIVE PROCEDURE:  Procedure(s): CATARACT EXTRACTION PHACO AND INTRAOCULAR LENS PLACEMENT (IOC)   SURGEON:  Birder Robson, MD.   ANESTHESIA:   Anesthesiologist: Gunnar Bulla, MD CRNA: Courtney Paris, CRNA  1.      Managed anesthesia care. 2.      Topical tetracaine drops followed by 2% Xylocaine jelly applied in the preoperative holding area.       3. 0.2 ml of epi-Shugarcaine was  placed in the anterior chamber following the paracentesis.    COMPLICATIONS:  None.   TECHNIQUE:   Stop and chop   DESCRIPTION OF PROCEDURE:  The patient was examined and consented in the preoperative holding area where the aforementioned topical anesthesia was applied to the left eye and then brought back to the Operating Room where the left eye was prepped and draped in the usual sterile ophthalmic fashion and a lid speculum was placed. A paracentesis was created with the side port blade and the anterior chamber was filled with viscoelastic. A near clear corneal incision was performed with the steel keratome. A continuous curvilinear capsulorrhexis was performed with a cystotome followed by the capsulorrhexis forceps. Hydrodissection and hydrodelineation were carried out with BSS on a blunt cannula. The lens was removed in a stop and chop  technique and the remaining cortical material was removed with the irrigation-aspiration handpiece. The capsular bag was inflated with viscoelastic and the Technis ZCB00 lens was placed in the capsular bag without complication. The remaining viscoelastic was removed from the eye with the irrigation-aspiration handpiece. The wounds were hydrated. The anterior chamber was flushed with Miostat and the eye was inflated to physiologic pressure. 0.1 mL of cefuroxime concentration 10 mg/mL was placed in the anterior chamber. The wounds were found  to be water tight. The eye was dressed with Vigamox. The patient was given protective glasses to wear throughout the day and a shield with which to sleep tonight. The patient was also given drops with which to begin a drop regimen today and will follow-up with me in one day.  Implant Name Type Inv. Item Serial No. Manufacturer Lot No. LRB No. Used  LENS IOL DIOP 19.0 - [id number] Intraocular Lens LENS IOL DIOP 19.0 [id number]   Left 1   Procedure(s) with comments: CATARACT EXTRACTION PHACO AND INTRAOCULAR LENS PLACEMENT (IOC) (Left) - [place] 00:28 AP% 21.6 CDE 6.18 fluid pack lot # :[id number]  Electronically signed: Varsha Knock LOUIS 11/08/2015 12:02 PM

## 2015-11-08 NOTE — Transfer of Care (Signed)
Immediate Anesthesia Transfer of Care Note  Patient: Roger Mack  Procedure(s) Performed: Procedure(s) with comments: CATARACT EXTRACTION PHACO AND INTRAOCULAR LENS PLACEMENT (IOC) (Left) - [place] 00:28 AP% 21.6 CDE 6.18 fluid pack lot # [id number]  Patient Location: PACU and Short Stay  Anesthesia Type:MAC  Level of Consciousness: awake, oriented and patient cooperative  Airway & Oxygen Therapy: Patient Spontanous Breathing  Post-op Assessment: Report given to RN and Post -op Vital signs reviewed and stable  Post vital signs: stable  Last Vitals:  Filed Vitals:   11/08/15 1037  BP: 126/82  Pulse: 75  Temp: 36.7 C  Resp: 14    Last Pain:  Filed Vitals:   11/08/15 1038  PainSc: 5          Complications: No apparent anesthesia complications

## 2015-11-08 NOTE — Anesthesia Postprocedure Evaluation (Signed)
Anesthesia Post Note  Patient: Roger Mack  Procedure(s) Performed: Procedure(s) (LRB): CATARACT EXTRACTION PHACO AND INTRAOCULAR LENS PLACEMENT (IOC) (Left)  Patient location during evaluation: Other Anesthesia Type: General Level of consciousness: awake and alert Pain management: pain level controlled Vital Signs Assessment: post-procedure vital signs reviewed and stable Respiratory status: spontaneous breathing, nonlabored ventilation, respiratory function stable and patient connected to nasal cannula oxygen Cardiovascular status: blood pressure returned to baseline and stable Postop Assessment: no signs of nausea or vomiting Anesthetic complications: no    Last Vitals:  Filed Vitals:   11/08/15 1206 11/08/15 1218  BP: 122/71 121/89  Pulse: 58 74  Temp: 35.8 C   Resp: 16 16    Last Pain:  Filed Vitals:   11/08/15 1221  PainSc: Winder

## 2015-11-08 NOTE — H&P (Signed)
  All labs reviewed. Abnormal studies sent to patients PCP when indicated.  Previous H&P reviewed, patient examined, there are NO CHANGES.  Roger France LOUIS6/27/201711:37 AM

## 2016-10-09 ENCOUNTER — Telehealth: Payer: Self-pay | Admitting: Pharmacy Technician

## 2016-10-09 NOTE — Telephone Encounter (Signed)
Patient eligible to receive medication assistance at [facility] until 06/14/17 as long as eligibility requirements continue to be met.  Betty J. Kluttz Care Manager [facility] 

## 2016-10-18 ENCOUNTER — Telehealth: Payer: Self-pay | Admitting: Pharmacy Technician

## 2016-10-18 NOTE — Telephone Encounter (Signed)
Patient eligible to receive medication assistance at [facility] until 06/14/17 as long as eligibility requirements continue to be met.  Demonie Kassa J. Jusitn Salsgiver Care Manager [facility] 

## 2016-11-02 ENCOUNTER — Telehealth: Payer: Self-pay | Admitting: Pharmacist

## 2016-11-02 NOTE — Telephone Encounter (Signed)
11/02/2016 Faxed Pf[employer] application for Procardia 10mg  Take 2 capsules by mouth two times a day # 180. This will not be a full 90 day supply. So I am also printing a new script for # 360 (90 day supply) and mailing to provider Dr. Marygrace Drought @ [facility] to sign and return.Roger Mack

## 2017-01-29 ENCOUNTER — Other Ambulatory Visit: Payer: Self-pay | Admitting: Pharmacist

## 2017-02-11 ENCOUNTER — Ambulatory Visit
Admission: RE | Admit: 2017-02-11 | Discharge: 2017-02-11 | Disposition: A | Discharge status: Home / Self Care | Payer: Disability Insurance | Source: Ambulatory Visit | Attending: General Practice | Admitting: General Practice

## 2017-02-11 ENCOUNTER — Other Ambulatory Visit: Payer: Self-pay | Admitting: General Practice

## 2017-02-11 DIAGNOSIS — M2578 Osteophyte, vertebrae: Secondary | ICD-10-CM | POA: Insufficient documentation

## 2017-02-11 DIAGNOSIS — M47812 Spondylosis without myelopathy or radiculopathy, cervical region: Secondary | ICD-10-CM | POA: Insufficient documentation

## 2017-02-11 DIAGNOSIS — M542 Cervicalgia: Secondary | ICD-10-CM

## 2017-02-11 DIAGNOSIS — M25569 Pain in unspecified knee: Secondary | ICD-10-CM | POA: Insufficient documentation

## 2017-02-11 DIAGNOSIS — Z981 Arthrodesis status: Secondary | ICD-10-CM | POA: Insufficient documentation

## 2017-02-11 DIAGNOSIS — I10 Essential (primary) hypertension: Secondary | ICD-10-CM | POA: Insufficient documentation

## 2017-02-11 DIAGNOSIS — Z7409 Other reduced mobility: Secondary | ICD-10-CM

## 2017-02-11 DIAGNOSIS — M25562 Pain in left knee: Secondary | ICD-10-CM

## 2017-02-11 DIAGNOSIS — M25561 Pain in right knee: Secondary | ICD-10-CM

## 2017-02-11 DIAGNOSIS — G8929 Other chronic pain: Secondary | ICD-10-CM

## 2017-02-11 DIAGNOSIS — M25519 Pain in unspecified shoulder: Secondary | ICD-10-CM | POA: Diagnosis not present

## 2017-02-11 DIAGNOSIS — M48061 Spinal stenosis, lumbar region without neurogenic claudication: Secondary | ICD-10-CM | POA: Diagnosis not present

## 2017-02-11 DIAGNOSIS — Z85828 Personal history of other malignant neoplasm of skin: Secondary | ICD-10-CM | POA: Diagnosis not present

## 2017-02-11 DIAGNOSIS — H52209 Unspecified astigmatism, unspecified eye: Secondary | ICD-10-CM | POA: Insufficient documentation

## 2017-02-11 DIAGNOSIS — M25512 Pain in left shoulder: Secondary | ICD-10-CM

## 2017-02-11 DIAGNOSIS — M25511 Pain in right shoulder: Secondary | ICD-10-CM

## 2017-02-11 DIAGNOSIS — I700 Atherosclerosis of aorta: Secondary | ICD-10-CM | POA: Diagnosis not present

## 2017-06-04 ENCOUNTER — Telehealth: Payer: Self-pay | Admitting: Pharmacy Technician

## 2017-06-04 NOTE — Telephone Encounter (Signed)
Patient eligible to receive medication assistance at [facility] through 2019, as long as eligibility requirements continue to be met.  [facility]

## 2017-06-11 ENCOUNTER — Ambulatory Visit: Payer: Disability Insurance

## 2017-06-11 ENCOUNTER — Other Ambulatory Visit: Payer: Self-pay

## 2017-06-11 ENCOUNTER — Encounter (INDEPENDENT_AMBULATORY_CARE_PROVIDER_SITE_OTHER): Payer: Self-pay

## 2017-06-11 VITALS — BP 132/68 | Ht 73.0 in | Wt 207.0 lb

## 2017-06-11 DIAGNOSIS — Z79899 Other long term (current) drug therapy: Secondary | ICD-10-CM
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
## 2017-11-07 IMAGING — CR DG CERVICAL SPINE 2 OR 3 VIEWS
4 series · 4 of 4 positions shown · non-contrast
Comparison: 02/28/2007

CLINICAL DATA: Chronic neck pain

EXAM:
CERVICAL SPINE - 2-3 VIEW

[c-spine lat]
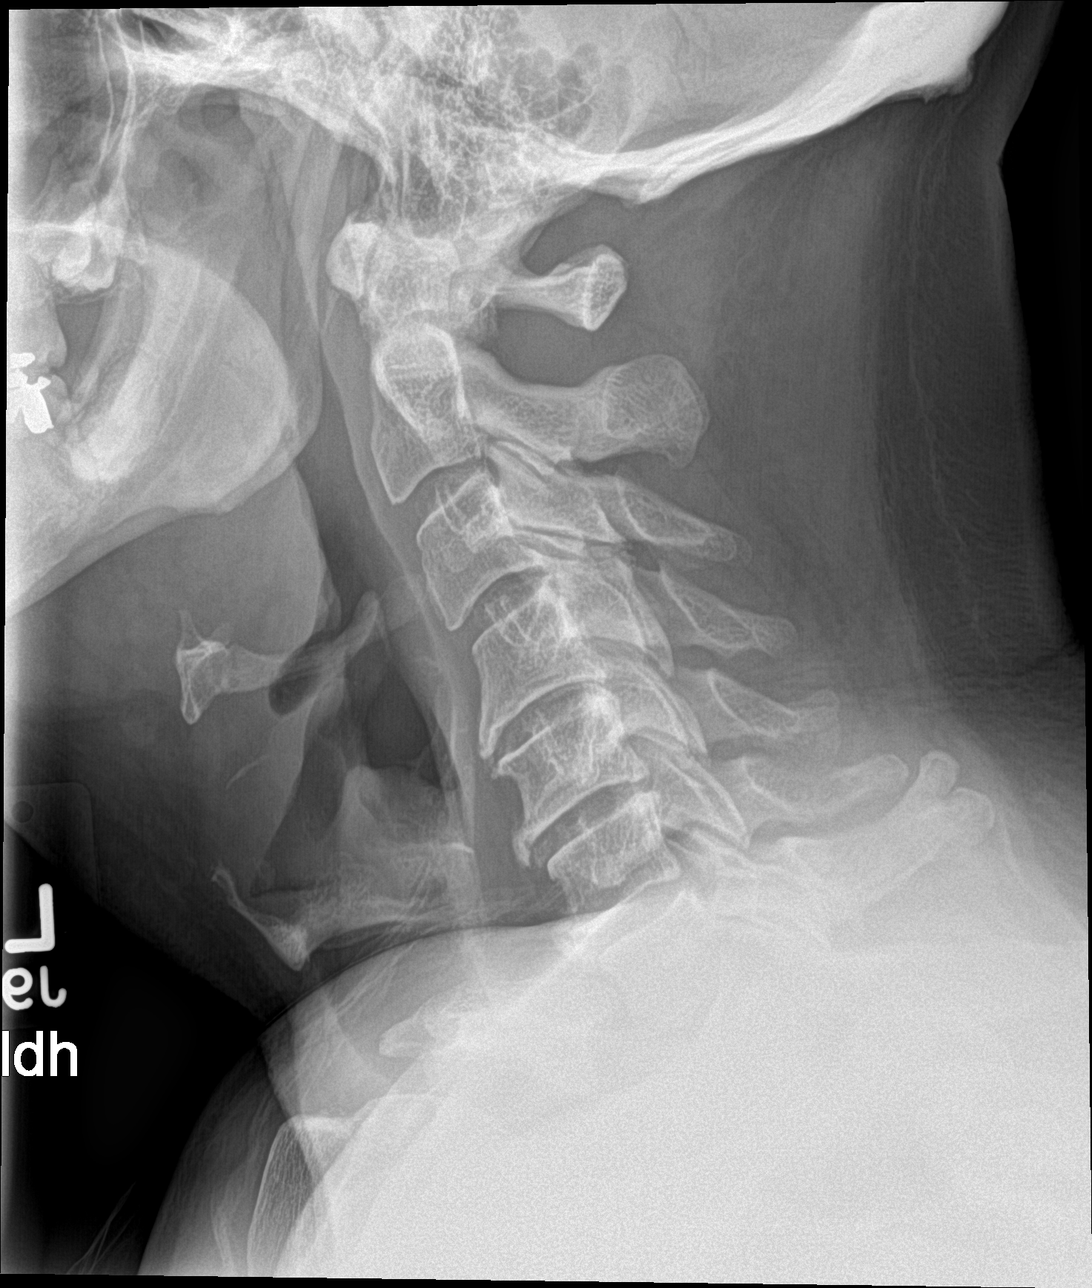

[c-spine ap]
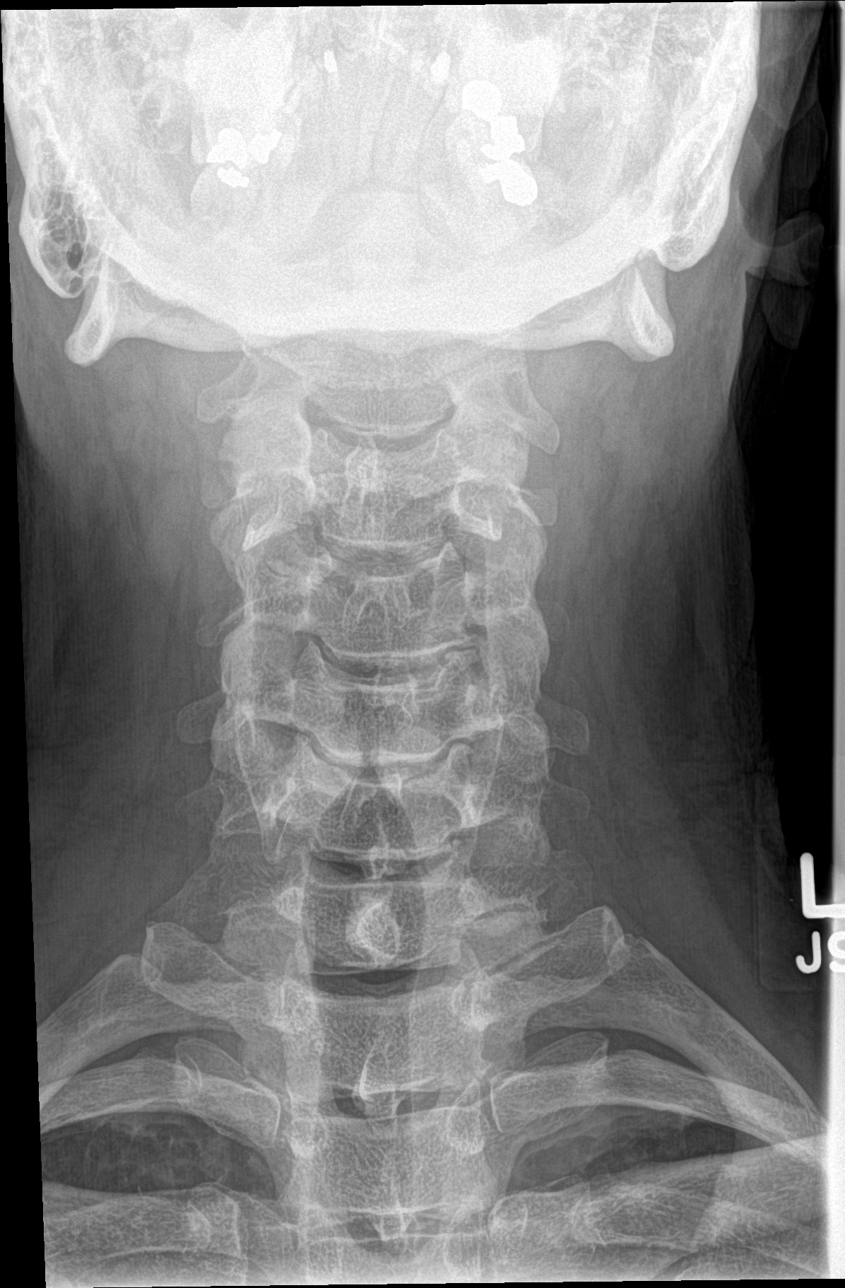

[c-spine open mouth]
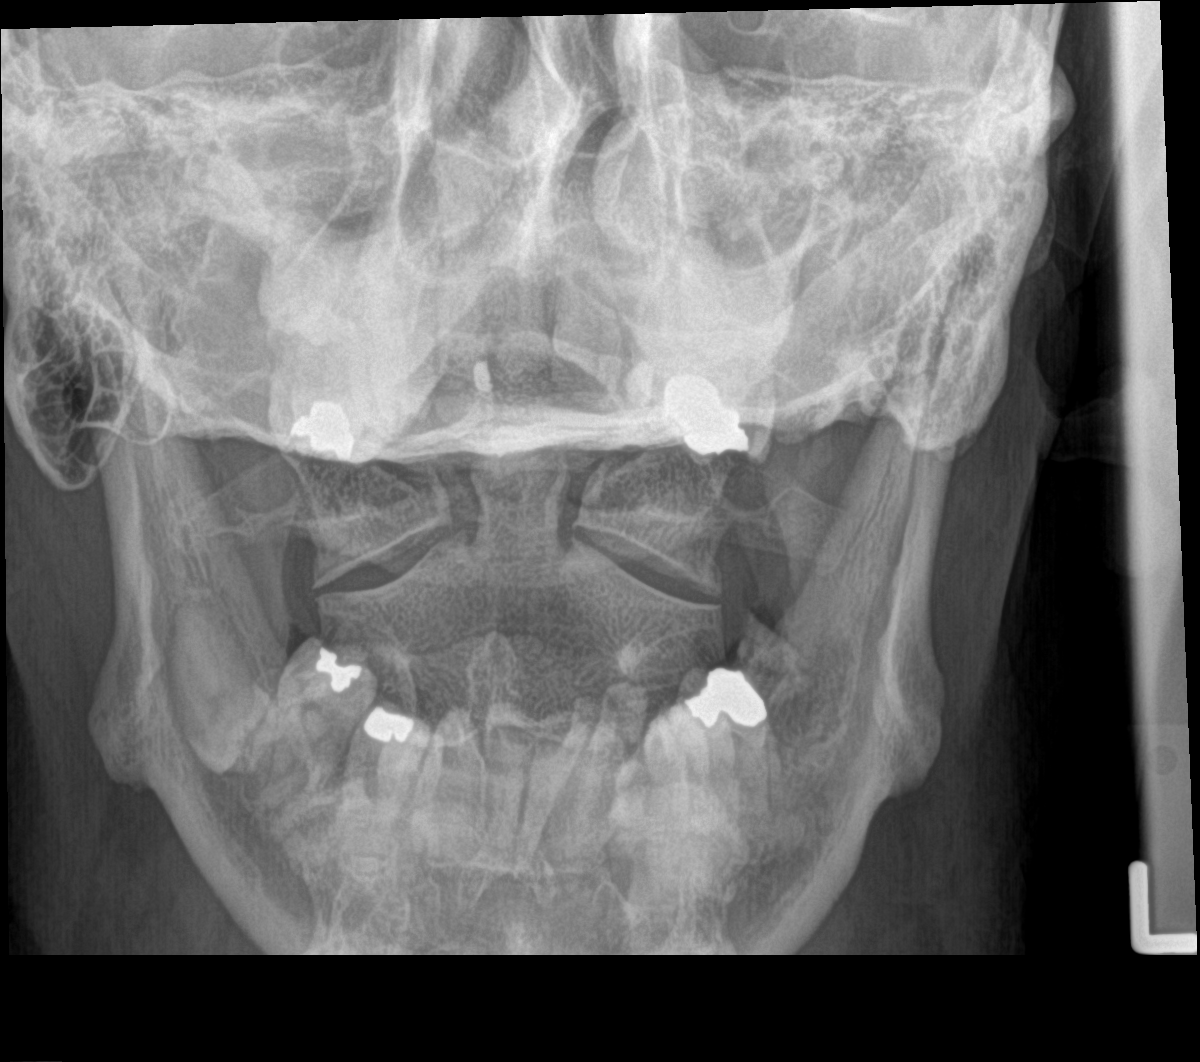

[c-spine swimmers]
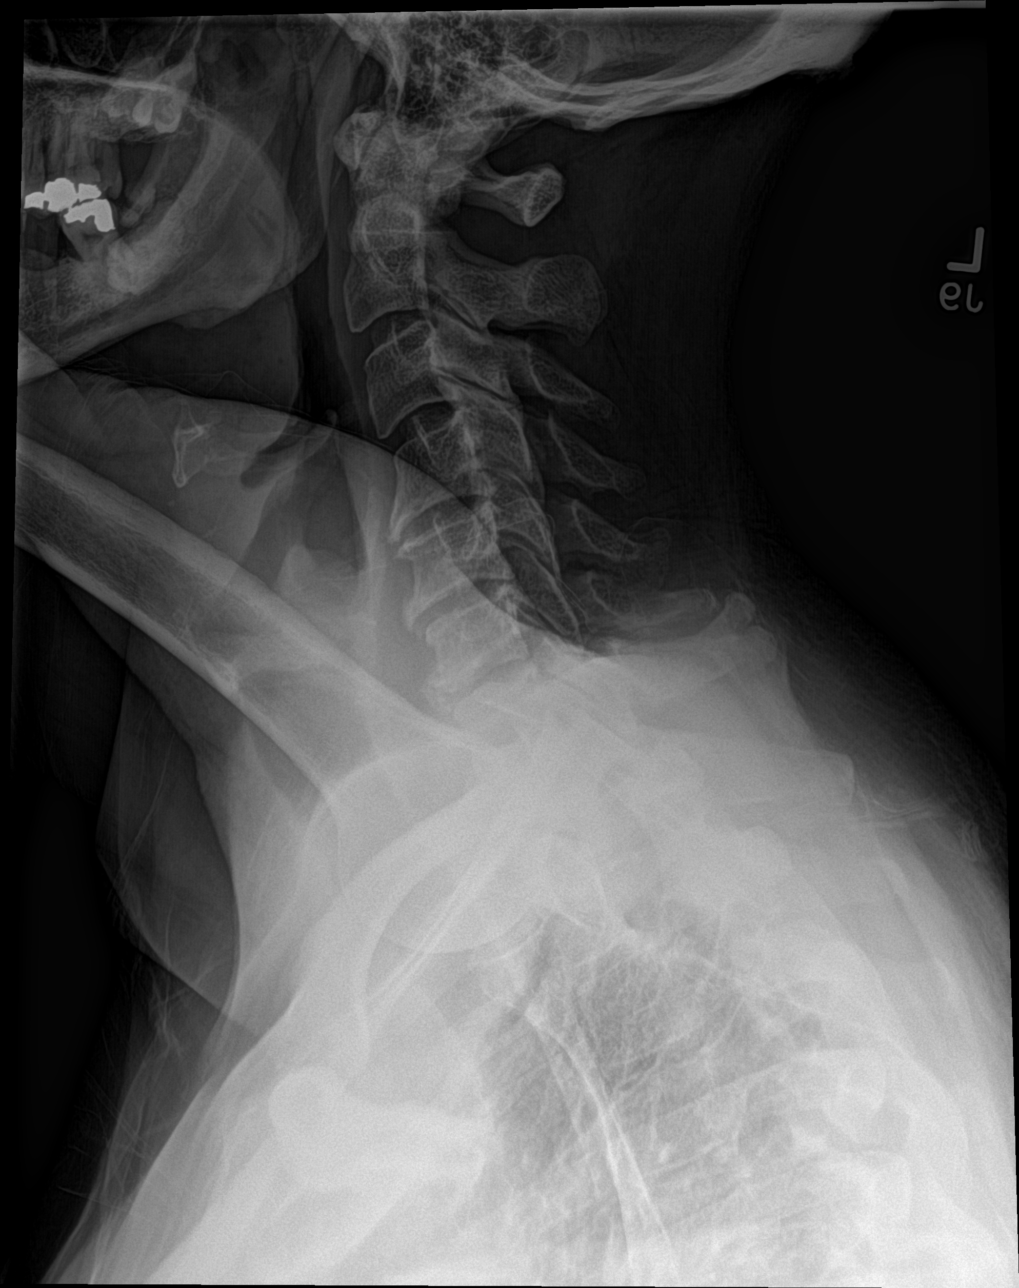

[4 of 4 positions shown; findings below may reference images not displayed]

FINDINGS: Straightening of the normally lordotic cervical spine is stable.
There is no vertebral compression deformity. Mild narrowing of the
C4-5 and C5-6 discs with posterior osteophytic ridging. Unremarkable
prevertebral soft tissues. Odontoid is intact.
IMPRESSION: No acute bony pathology.  Degenerative changes.

## 2018-05-19 ENCOUNTER — Encounter: Payer: Disability Insurance | Admitting: Pharmacist

## 2018-12-24 ENCOUNTER — Telehealth: Payer: Self-pay | Admitting: Pharmacy Technician

## 2018-12-24 NOTE — Telephone Encounter (Signed)
Patient failed to provide2020 financial documentation. No additional medication assistance will be provided by [facility] without the required proof of income documentation. Patient notified by letter.  Velda Shell, CPhT [facility]
